# Patient Record
Sex: Female | Born: 2000 | Race: White | Hispanic: No | Marital: Single | Smoking: Never smoker
Health system: Southern US, Community
[De-identification: ages and names within clinical notes are randomized; demographics above are authoritative.]

---
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
## 2019-03-25 ENCOUNTER — Emergency Department (HOSPITAL_COMMUNITY)
Admission: EM | Admit: 2019-03-25 | Discharge: 2019-03-26 | Disposition: A | Discharge status: Other Acute Inpatient Hospital | Payer: Medicaid Other | Attending: Emergency Medicine | Admitting: Emergency Medicine

## 2019-03-25 ENCOUNTER — Encounter (HOSPITAL_COMMUNITY): Payer: Self-pay | Admitting: Emergency Medicine

## 2019-03-25 ENCOUNTER — Other Ambulatory Visit: Payer: Self-pay

## 2019-03-25 DIAGNOSIS — Z79899 Other long term (current) drug therapy: Secondary | ICD-10-CM | POA: Insufficient documentation

## 2019-03-25 DIAGNOSIS — X788XXA Intentional self-harm by other sharp object, initial encounter: Secondary | ICD-10-CM | POA: Diagnosis not present

## 2019-03-25 DIAGNOSIS — F332 Major depressive disorder, recurrent severe without psychotic features: Secondary | ICD-10-CM | POA: Insufficient documentation

## 2019-03-25 DIAGNOSIS — Z20828 Contact with and (suspected) exposure to other viral communicable diseases: Secondary | ICD-10-CM | POA: Insufficient documentation

## 2019-03-25 DIAGNOSIS — Z7289 Other problems related to lifestyle: Secondary | ICD-10-CM

## 2019-03-25 DIAGNOSIS — F410 Panic disorder [episodic paroxysmal anxiety] without agoraphobia: Secondary | ICD-10-CM | POA: Diagnosis present

## 2019-03-25 LAB — COMPREHENSIVE METABOLIC PANEL
ALT: 24 U/L (ref 0–44)
AST: 20 U/L (ref 15–41)
Albumin: 4.4 g/dL (ref 3.5–5.0)
Alkaline Phosphatase: 54 U/L (ref 38–126)
Anion gap: 8 (ref 5–15)
BUN: 12 mg/dL (ref 6–20)
CO2: 24 mmol/L (ref 22–32)
Calcium: 9.6 mg/dL (ref 8.9–10.3)
Chloride: 108 mmol/L (ref 98–111)
Creatinine, Ser: 1.04 mg/dL — ABNORMAL HIGH (ref 0.44–1.00)
GFR calc Af Amer: >60 mL/min (ref >60)
GFR calc non Af Amer: >60 mL/min (ref >60)
Glucose, Bld: 98 mg/dL (ref 70–99)
Potassium: 4.1 mmol/L (ref 3.5–5.1)
Sodium: 140 mmol/L (ref 135–145)
Total Bilirubin: 0.6 mg/dL (ref 0.3–1.2)
Total Protein: 6.9 g/dL (ref 6.5–8.1)

## 2019-03-25 LAB — CBC
HCT: 43 % (ref 36.0–46.0)
Hemoglobin: 14.5 g/dL (ref 12.0–15.0)
MCH: 30 pg (ref 26.0–34.0)
MCHC: 33.7 g/dL (ref 30.0–36.0)
MCV: 88.8 fL (ref 80.0–100.0)
Platelets: 258 10*3/uL (ref 150–400)
RBC: 4.84 MIL/uL (ref 3.87–5.11)
RDW: 11.8 % (ref 11.5–15.5)
WBC: 7.9 10*3/uL (ref 4.0–10.5)
nRBC: 0 % (ref 0.0–0.2)

## 2019-03-25 LAB — ACETAMINOPHEN LEVEL: Acetaminophen (Tylenol), Serum: <10 ug/mL — ABNORMAL LOW (ref 10–30)

## 2019-03-25 LAB — I-STAT BETA HCG BLOOD, ED (MC, WL, AP ONLY): I-stat hCG, quantitative: <5 m[IU]/mL (ref <5)

## 2019-03-25 LAB — SALICYLATE LEVEL: Salicylate Lvl: <7 mg/dL (ref 2.8–30.0)

## 2019-03-25 LAB — ETHANOL: Alcohol, Ethyl (B): <10 mg/dL (ref <10)

## 2019-03-25 NOTE — ED Notes (Signed)
Pt in Peds Triage

## 2019-03-25 NOTE — ED Notes (Signed)
Patient has been wanded by security.  

## 2019-03-25 NOTE — ED Notes (Addendum)
Bailey Bowen (mother): [phone], would like an update when possible. Okayed by patient.   Pt has changed into scrubs and her belongings were collected and given to her mother to take home. Security has been called to wand patient.

## 2019-03-25 NOTE — ED Triage Notes (Signed)
Patient presents with self-inflicted laceration at left wrist today using a razor blade , denies hallucinations , seen at [facility] clinic at [place] where laceration was steri stripped.

## 2019-03-26 LAB — SARS CORONAVIRUS 2 BY RT PCR (HOSPITAL ORDER, PERFORMED IN ~~LOC~~ HOSPITAL LAB): SARS Coronavirus 2: NEGATIVE

## 2019-03-26 LAB — RAPID URINE DRUG SCREEN, HOSP PERFORMED
Amphetamines: NOT DETECTED
Barbiturates: NOT DETECTED
Benzodiazepines: NOT DETECTED
Cocaine: NOT DETECTED
Opiates: NOT DETECTED
Tetrahydrocannabinol: NOT DETECTED

## 2019-03-26 NOTE — BH Assessment (Addendum)
Tele Assessment Note   Patient Name: Bailey Bowen MRN: [id number] Referring Physician: Quincy Carnes, PA Location of Patient: [facility] Location of Provider: [place]  [place] Bailey Bowen is an 18 y.o. female.  -Clinician reviewed note by Rachael Fee, PA.  Pt89 year old female presenting to the ED for psychiatric evaluation.  Patient reports she has history of anxiety, was started on Wellbutrin about 2 weeks ago.  States she has anxiety over very minor issues, however this causes her to want to hurt herself.  States today she cut her left forearm with a razor blade after having a panic attack.   Patient cannot identify a specific stressor that led up to her panic attack today. She said that there are a lot of little things that can make her have a panic attack, she has 1-2 of them a week.  Patient said that she was at work and started hyperventilating.  She went home and had been having thoughts of killing herself today (09/29).  She said that "within 30 seconds of getting inside the house I had gotten a razor and cut my wrist."  She required 19 stitches and 4 steri-strips.  She had made two vertical cuts.  She has had SI off and on over the last 6 months.  Patient says that she has not had any prior suicide attempts but she has had a hx of cutting.  Patient denies any HI.  Patient says that she sometimes sees shadows in her room before she goes to sleep.  She says at times she feels like someone may be lurking in her backseat while driving.  Patient denies any ETOH or other illicit drug use.  Patient lives with her parents, an older brother and a younger sister.  Patient has good eye contact.  She does say she has lost 10 lbs over the last two weeks or so.  Patient appears depressed and anxious and mood matches affect.  Patient speech is regular and logical/coherent.  Patient had some counseling when she was in 2nd grade but cannot recall what it was about.  Patient has had no  previous inpatient experience. She has no outpatient provider now.  Her GYN is prescribing the welbutrin which she started 2 weeks ago.  She reports increased anxiety since taking it.   -Clinician discussed patient care with Anette Riedel, NP who said that patient meets inpatient care criteria.  Clinician informed Quincy Carnes, PA of disposition.  TTS to seek placement since no available beds at [facility].  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 No pertinent past medical history.  History reviewed. No pertinent surgical history.  Family History: No family history on file.  Social History:  reports that she has never smoked. She has never used smokeless tobacco. She reports that she does not drink alcohol or use drugs.  Additional Social History:  Alcohol / Drug Use Prescriptions: Welbutrin 150mg  once daily (started about 2 weeks ago) Over the Counter: N/A History of alcohol / drug use?: No history of alcohol / drug abuse  CIWA: CIWA-Ar BP: 123/70 Pulse Rate: 96 COWS:    Allergies: No Known Allergies  Home Medications: (Not in a hospital admission)   OB/GYN Status:  Patient's last menstrual period was 03/12/2019.  General Assessment Data Location of Assessment: [facility] ED TTS Assessment: In system Is this a Tele or Face-to-Face Assessment?: Tele Assessment Is this an Initial Assessment or a Re-assessment for this encounter?: Initial  Assessment Patient Accompanied by:: N/A Language Other than English: No Living Arrangements: Other (Comment)(Lives with parents, younger sister and older brother) What gender do you identify as?: Female Marital status: Single Pregnancy Status: No Living Arrangements: Parent Can pt return to current living arrangement?: Yes Admission Status: Voluntary Is patient capable of signing voluntary admission?: Yes Referral Source: MD(Mother brought her in.) Insurance type: self pay      Crisis Care Plan Living Arrangements: Parent Name of Psychiatrist: None Name of Therapist: None  Education Status Is patient currently in school?: No Is the patient employed, unemployed or receiving disability?: Employed  Risk to self with the past 6 months Suicidal Ideation: Yes-Currently Present Has patient been a risk to self within the past 6 months prior to admission? : Yes Suicidal Intent: Yes-Currently Present Has patient had any suicidal intent within the past 6 months prior to admission? : Yes Is patient at risk for suicide?: Yes Suicidal Plan?: Yes-Currently Present Has patient had any suicidal plan within the past 6 months prior to admission? : No Specify Current Suicidal Plan: Cutting wrists Access to Means: Yes Specify Access to Suicidal Means: Sharps What has been your use of drugs/alcohol within the last 12 months?: None Previous Attempts/Gestures: No How many times?: 0 Other Self Harm Risks: Yes Triggers for Past Attempts: None known Intentional Self Injurious Behavior: Cutting Comment - Self Injurious Behavior: 1-2 months ago Family Suicide History: Yes Recent stressful life event(s): Other (Comment)(Lot of little things boiling over) Persecutory voices/beliefs?: Yes Depression: Yes Depression Symptoms: Despondent, Loss of interest in usual pleasures, Feeling worthless/self pity, Fatigue, Tearfulness, Isolating Substance abuse history and/or treatment for substance abuse?: No Suicide prevention information given to non-admitted patients: Not applicable  Risk to Others within the past 6 months Homicidal Ideation: No Does patient have any lifetime risk of violence toward others beyond the six months prior to admission? : Unknown Thoughts of Harm to Others: No Current Homicidal Intent: No Current Homicidal Plan: No Access to Homicidal Means: No Identified Victim: No one History of harm to others?: No Assessment of Violence: None Noted Violent Behavior  Description: None reported Does patient have access to weapons?: Yes (Comment)(Guns are secured.) Criminal Charges Pending?: No Does patient have a court date: No Is patient on probation?: No  Psychosis Hallucinations: Visual(Sometimes at night shadow people) Delusions: None noted  Mental Status Report Appearance/Hygiene: Unremarkable, In scrubs Eye Contact: Good Motor Activity: Freedom of movement, Unremarkable Speech: Logical/coherent Level of Consciousness: Alert, Crying Mood: Depressed, Anxious, Despair, Sad Affect: Depressed Anxiety Level: Panic Attacks Panic attack frequency: Once every two days Most recent panic attack: Yesterday Thought Processes: Coherent, Relevant Judgement: Impaired Orientation: Person, Place, Situation, Time Obsessive Compulsive Thoughts/Behaviors: Minimal  Cognitive Functioning Concentration: Poor Memory: Recent Impaired, Remote Intact Is patient IDD: No Insight: Fair Impulse Control: Poor Appetite: Poor Have you had any weight changes? : Loss Amount of the weight change? (lbs): (10 lbs in last two weeks.) Sleep: Increased Total Hours of Sleep: (Will sleep more if not needed at work) Vegetative Symptoms: Staying in bed  ADLScreening [facility]) Patient's cognitive ability adequate to safely complete daily activities?: Yes Patient able to express need for assistance with ADLs?: Yes Independently performs ADLs?: Yes (appropriate for developmental age)  Prior Inpatient Therapy Prior Inpatient Therapy: No  Prior Outpatient Therapy Prior Outpatient Therapy: Yes Prior Therapy Dates: 11 years ago Prior Therapy Facilty/Provider(s): Can't recall Reason for Treatment: Had some counseling Does patient have an ACCT team?: No Does patient have Intensive  In-House Services?  : No Does patient have [facility] services? : No Does patient have P4CC services?: No  ADL Screening (condition at time of admission) Patient's cognitive ability  adequate to safely complete daily activities?: Yes Is the patient deaf or have difficulty hearing?: No Does the patient have difficulty seeing, even when wearing glasses/contacts?: No Does the patient have difficulty concentrating, remembering, or making decisions?: Yes Patient able to express need for assistance with ADLs?: Yes Does the patient have difficulty dressing or bathing?: No Independently performs ADLs?: Yes (appropriate for developmental age) Does the patient have difficulty walking or climbing stairs?: No Weakness of Legs: None Weakness of Arms/Hands: None       Abuse/Neglect Assessment (Assessment to be complete while patient is alone) Abuse/Neglect Assessment Can Be Completed: Yes Physical Abuse: Denies Verbal Abuse: Denies Sexual Abuse: Denies Exploitation of patient/patient's resources: Denies Self-Neglect: Denies     Merchant navy officer (For Healthcare) Does Patient Have a Medical Advance Directive?: No Would patient like information on creating a medical advance directive?: No - Patient declined          Disposition:  Disposition Initial Assessment Completed for this Encounter: Yes Patient referred to: Other (Comment)(Pt to be reviewed by [facility])  This service was provided via telemedicine using a 2-way, interactive audio and video technology.  Names of all persons participating in this telemedicine service and their role in this encounter. Name: Bailey Bowen Role: patient  Name: Beatriz Stallion, M.S. LCAS QP Role: clinician  Name:  Role:   Name:  Role:     [place] Lodge 03/26/2019 3:13 AM

## 2019-03-26 NOTE — ED Notes (Addendum)
[facility] accepted pt. [facility] will call RN with bed number. Transport later this afternoon.

## 2019-03-26 NOTE — Progress Notes (Signed)
Pt accepted to [facility]   Dr. Orvilla Cornwall is the accepting/attending provider.    Call report to [place] @ [facility] ED notified.     Pt is voluntary and will be transported by [facility].   Pt is scheduled to arrive this afternoon, pending discharges. Admissions staff will call [facility] ED within the next two hours to notify of bed availability.   Audree Camel, LCSW, [place] Disposition [facility]/TTS [phone]
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
## 2019-03-26 NOTE — ED Notes (Signed)
Pt calling her mom at this time

## 2019-03-26 NOTE — ED Notes (Signed)
RN talked to Amaryllis Dyke, NP @ [facility] looking to accept pt there.

## 2019-03-26 NOTE — ED Notes (Signed)
Breakfast Ordered 

## 2019-03-26 NOTE — ED Notes (Signed)
RN talked to pts mom and gave her an update.

## 2019-03-26 NOTE — ED Provider Notes (Signed)
[place] EMERGENCY DEPARTMENT Provider Note   CSN: [id number] Arrival date & time: 03/25/19  1922     History   Chief Complaint Chief Complaint  Patient presents with  . Suicidal    HPI Bailey Bowen is a 18 y.o. female.     The history is provided by the patient and medical records.     18 year old female presenting to the ED for psychiatric evaluation.  Patient reports she has history of anxiety, was started on Wellbutrin about 2 weeks ago.  States she has anxiety over very minor issues, however this causes her to want to hurt herself.  States today she cut her left forearm with a razor blade after having a panic attack.  States it was over something stupid and very insignificant.  States she cannot even recall what it was.  She was seen at urgent care had wound cleansed and Steri-Strips applied.  States she was sent here for further evaluation.  She denies wanting to kill herself or thoughts of this, however does not feel like she can control the impulses to hurt herself when she has anxiety attack.  She denies any homicidal ideation.  No hallucinations.  States she is not quite sure if the Wellbutrin is helping her, states sometimes she actually feels worse when taking this.  She does report sensation of racing heartbeat since starting this as well as some intermittent nausea and decreased appetite.  She does report some increased stress at home, mostly with work.  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  States she is only sleeping about 5 to 6 hours a night consistently.  History reviewed. No pertinent past medical history.  There are no active problems to display for this patient.   History reviewed. No pertinent surgical history.   OB History   No obstetric history on file.      Home Medications    Prior to Admission medications   Not on File    Family History No family history on file.  Social  History Social History   Tobacco Use  . Smoking status: Never Smoker  . Smokeless tobacco: Never Used  Substance Use Topics  . Alcohol use: Never    Frequency: Never  . Drug use: Never     Allergies   Patient has no known allergies.   Review of Systems Review of Systems  Skin: Positive for wound.  All other systems reviewed and are negative.    Physical Exam Updated Vital Signs BP 123/70 (BP Location: Left Arm)   Pulse 96   Temp 98.3 F (36.8 C) (Oral)   Resp 18   LMP 03/12/2019   SpO2 100%   Physical Exam Vitals signs and nursing note reviewed.  Constitutional:      Appearance: She is well-developed.  HENT:     Head: Normocephalic and atraumatic.  Eyes:     Conjunctiva/sclera: Conjunctivae normal.     Pupils: Pupils are equal, round, and reactive to light.  Neck:     Musculoskeletal: Normal range of motion.  Cardiovascular:     Rate and Rhythm: Normal rate and regular rhythm.     Heart sounds: Normal heart sounds.  Pulmonary:     Effort: Pulmonary effort is normal.     Breath sounds: Normal breath sounds.  Abdominal:     General: Bowel sounds are normal.     Palpations: Abdomen is soft.  Musculoskeletal: Normal range of motion.  Comments: Multiple superficial abrasion to left volar forearm, clean with steri-strips overlying, no bleeding or signs of infection  Skin:    General: Skin is warm and dry.  Neurological:     Mental Status: She is alert and oriented to person, place, and time.  Psychiatric:     Comments: Calm, cooperative, seems to have insight into her problems Denies SI/HI/AVH      ED Treatments / Results  Labs (all labs ordered are listed, but only abnormal results are displayed) Labs Reviewed  COMPREHENSIVE METABOLIC PANEL - Abnormal; Notable for the following components:      Result Value   Creatinine, Ser 1.04 (*)    All other components within normal limits  ACETAMINOPHEN LEVEL - Abnormal; Notable for the following  components:   Acetaminophen (Tylenol), Serum <10 (*)    All other components within normal limits  SARS CORONAVIRUS 2 (HOSPITAL ORDER, PERFORMED IN [facility])  ETHANOL  SALICYLATE LEVEL  CBC  RAPID URINE DRUG SCREEN, HOSP PERFORMED  I-STAT BETA HCG BLOOD, ED ([facility], [facility], [facility] ONLY)    EKG None  Radiology No results found.  Procedures Procedures (including critical care time)  Medications Ordered in ED Medications - No data to display   Initial Impression / Assessment and Plan / ED Course  I have reviewed the triage vital signs and the nursing notes.  Pertinent labs & imaging results that were available during my care of the patient were reviewed by me and considered in my medical decision making (see chart for details).  18 y.o. F here for psychiatric evaluation.  Reports history of anxiety, recently started on Wellbutrin but has not noticed any improvement.  She states she gets panic attacks and wants to hurt herself when this happens.  States she panics of insignificant matters.  She did cut her left arm with razor blade today.  she was seen at urgent care for this, had wounds cleansed and steri-stripped.  They remain hemostatic and do not require further repair, etc.  Denies S/HI/AVH to me.  Denies illicit drug use or alcohol abuse.  Does have some stress from work and lack of sleep.  Labs overall reassuring. Patient medically cleared, TTS to evaluate.  TTS has evaluated-- she did admit to some SI and feeling like someone is in her car when driving at night, possibly hearing some voices as well.  She meets IP criteria so will seek placed.  COVID screen ordered and is negative.  Final Clinical Impressions(s) / ED Diagnoses   Final diagnoses:  Self-mutilation    ED Discharge Orders    None       Garlon Hatchet, PA-C 03/26/19 [id number]    Nira Conn, MD 03/26/19 [phone]

## 2019-03-26 NOTE — ED Notes (Signed)
RN talked with accepting facility and they said they are pending 3 dc at this time. Once those beds become available they will call for report and give a bed number. They allow 1 visitor between 3p-9p.

## 2019-03-26 NOTE — Progress Notes (Signed)
Pt meets inpatient criteria per Anette Riedel, NP. Referral information has been sent to the following hospitals for review:  [facility]  [facility]  [facility]  [facility]     Disposition will continue to assist with inpatient placement needs.   Audree Camel, LCSW, [place] Disposition [facility]/TTS [phone]

## 2019-03-26 NOTE — ED Notes (Signed)
Spoke with pts mother. Updated her on her daughter. Pt accepted to [facility].

## 2020-09-09 DIAGNOSIS — G43909 Migraine, unspecified, not intractable, without status migrainosus: Secondary | ICD-10-CM
